# Patient Record
Sex: Female | Born: 1990 | Hispanic: Yes | Marital: Single | Smoking: Former smoker
Health system: Southern US, Community
[De-identification: ages and names within clinical notes are randomized; demographics above are authoritative.]

## PROBLEM LIST (undated history)

## (undated) DIAGNOSIS — F419 Anxiety disorder, unspecified: Secondary | ICD-10-CM

## (undated) DIAGNOSIS — A5609 Other chlamydial infection of lower genitourinary tract: Secondary | ICD-10-CM

## (undated) DIAGNOSIS — A5602 Chlamydial vulvovaginitis: Secondary | ICD-10-CM

## (undated) DIAGNOSIS — N946 Dysmenorrhea, unspecified: Secondary | ICD-10-CM

## (undated) DIAGNOSIS — F320 Major depressive disorder, single episode, mild: Secondary | ICD-10-CM

## (undated) DIAGNOSIS — D649 Anemia, unspecified: Secondary | ICD-10-CM

## (undated) DIAGNOSIS — E039 Hypothyroidism, unspecified: Secondary | ICD-10-CM

## (undated) HISTORY — DX: Other chlamydial infection of lower genitourinary tract: A56.09

## (undated) HISTORY — DX: Anxiety disorder, unspecified: F41.9

## (undated) HISTORY — DX: Anemia, unspecified: D64.9

## (undated) HISTORY — DX: Major depressive disorder, single episode, mild: F32.0

## (undated) HISTORY — DX: Chlamydial vulvovaginitis: A56.02

## (undated) HISTORY — DX: Hypothyroidism, unspecified: E03.9

## (undated) HISTORY — DX: Dysmenorrhea, unspecified: N94.6

## (undated) HISTORY — PX: BREAST ENHANCEMENT SURGERY: SHX7

## (undated) HISTORY — PX: DILATION AND CURETTAGE OF UTERUS: SHX78

---
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
## 2011-02-01 LAB — PREGNANCY, URINE: Preg Test, Ur: NEGATIVE

## 2011-02-01 LAB — URINALYSIS, ROUTINE W REFLEX MICROSCOPIC
Bilirubin Urine: NEGATIVE
Nitrite: NEGATIVE
Specific Gravity, Urine: 1.01 (ref 1.005–1.030)
Urobilinogen, UA: 0.2 mg/dL (ref 0.0–1.0)
pH: 7.5 (ref 5.0–8.0)

## 2011-02-01 LAB — WET PREP, GENITAL: Yeast Wet Prep HPF POC: NONE SEEN

## 2011-02-01 LAB — GC/CHLAMYDIA PROBE AMP, GENITAL: Chlamydia, DNA Probe: NEGATIVE

## 2012-03-11 DIAGNOSIS — Z331 Pregnant state, incidental: Secondary | ICD-10-CM | POA: Insufficient documentation

## 2015-01-13 ENCOUNTER — Other Ambulatory Visit (HOSPITAL_COMMUNITY): Payer: Self-pay | Admitting: Maternal and Fetal Medicine

## 2017-02-05 ENCOUNTER — Ambulatory Visit (INDEPENDENT_AMBULATORY_CARE_PROVIDER_SITE_OTHER): Payer: Managed Care, Other (non HMO)

## 2017-02-05 ENCOUNTER — Other Ambulatory Visit: Payer: Self-pay | Admitting: Physician Assistant

## 2017-02-05 ENCOUNTER — Encounter: Payer: Self-pay | Admitting: Physician Assistant

## 2017-02-05 ENCOUNTER — Other Ambulatory Visit: Payer: Self-pay

## 2017-02-05 ENCOUNTER — Ambulatory Visit (INDEPENDENT_AMBULATORY_CARE_PROVIDER_SITE_OTHER): Payer: Managed Care, Other (non HMO) | Admitting: Physician Assistant

## 2017-02-05 VITALS — BP 99/63 | HR 76 | Ht 63.5 in | Wt 153.0 lb

## 2017-02-05 DIAGNOSIS — J069 Acute upper respiratory infection, unspecified: Secondary | ICD-10-CM | POA: Diagnosis not present

## 2017-02-05 DIAGNOSIS — N946 Dysmenorrhea, unspecified: Secondary | ICD-10-CM

## 2017-02-05 DIAGNOSIS — R062 Wheezing: Secondary | ICD-10-CM

## 2017-02-05 DIAGNOSIS — F320 Major depressive disorder, single episode, mild: Secondary | ICD-10-CM

## 2017-02-05 DIAGNOSIS — Z0000 Encounter for general adult medical examination without abnormal findings: Secondary | ICD-10-CM

## 2017-02-05 DIAGNOSIS — F419 Anxiety disorder, unspecified: Secondary | ICD-10-CM

## 2017-02-05 DIAGNOSIS — E039 Hypothyroidism, unspecified: Secondary | ICD-10-CM | POA: Diagnosis not present

## 2017-02-05 DIAGNOSIS — R05 Cough: Secondary | ICD-10-CM

## 2017-02-05 HISTORY — DX: Dysmenorrhea, unspecified: N94.6

## 2017-02-05 HISTORY — DX: Major depressive disorder, single episode, mild: F32.0

## 2017-02-05 HISTORY — DX: Anxiety disorder, unspecified: F41.9

## 2017-02-05 LAB — COMPREHENSIVE METABOLIC PANEL
ALK PHOS: 58 U/L (ref 33–115)
ALT: 13 U/L (ref 6–29)
AST: 20 U/L (ref 10–30)
Albumin: 4.6 g/dL (ref 3.6–5.1)
BILIRUBIN TOTAL: 0.8 mg/dL (ref 0.2–1.2)
BUN: 13 mg/dL (ref 7–25)
CO2: 23 mmol/L (ref 20–31)
Calcium: 9.7 mg/dL (ref 8.6–10.2)
Chloride: 103 mmol/L (ref 98–110)
Creat: 0.9 mg/dL (ref 0.50–1.10)
GLUCOSE: 70 mg/dL (ref 65–99)
Potassium: 4.3 mmol/L (ref 3.5–5.3)
Sodium: 140 mmol/L (ref 135–146)
TOTAL PROTEIN: 7.7 g/dL (ref 6.1–8.1)

## 2017-02-05 LAB — CBC
HEMATOCRIT: 43.7 % (ref 35.0–45.0)
Hemoglobin: 14.4 g/dL (ref 11.7–15.5)
MCH: 29.1 pg (ref 27.0–33.0)
MCHC: 33 g/dL (ref 32.0–36.0)
MCV: 88.5 fL (ref 80.0–100.0)
MPV: 11.5 fL (ref 7.5–12.5)
PLATELETS: 265 10*3/uL (ref 140–400)
RBC: 4.94 MIL/uL (ref 3.80–5.10)
RDW: 12.9 % (ref 11.0–15.0)
WBC: 7 10*3/uL (ref 3.8–10.8)

## 2017-02-05 LAB — TSH: TSH: 5.85 mIU/L — ABNORMAL HIGH

## 2017-02-05 LAB — LIPID PANEL W/REFLEX DIRECT LDL
CHOLESTEROL: 134 mg/dL (ref <200)
HDL: 61 mg/dL (ref >50)
LDL-Cholesterol: 61 mg/dL
Non-HDL Cholesterol (Calc): 73 mg/dL (ref <130)
TRIGLYCERIDES: 43 mg/dL (ref <150)
Total CHOL/HDL Ratio: 2.2 Ratio (ref <5.0)

## 2017-02-05 MED ORDER — AZITHROMYCIN 250 MG PO TABS: ORAL_TABLET | ORAL | 0 refills | Status: DC

## 2017-02-05 MED ORDER — FLUTICASONE PROPIONATE 50 MCG/ACT NA SUSP: 1.0000 | Freq: Two times a day (BID) | NASAL | 0 refills | Status: DC

## 2017-02-05 NOTE — Progress Notes (Signed)
HPI:                                                                Sabrina Park is a 26 y.o. female who presents to [facility]: Primary Care Sports Medicine today to establish care   Current Concerns include congestion  Patient reports nasal congestion x 2 months. Endorses cough x 1 week. Has noticed some dyspnea with exertion. Denies wheezing, chest pain, SOB, hemoptysis. Denies history of allergies or asthma.   Health Maintenance Health Maintenance  Topic Date Due  . HIV Screening  06/23/2006  . TETANUS/TDAP  06/23/2010  . PAP SMEAR  06/23/2012  . INFLUENZA VACCINE  05/28/2017    GYN/Sexual Health  Menstrual status: having periods  LMP: 02/05/17  Menses: regular, dysmenorrhea  Last pap smear: 11/2016, NILM - performed at gynecologist in [place]  History of abnormal pap smears: no  Sexually active: not currently  Current contraception: none  Health Habits  Diet: good  Exercise: cardio weight daily  Past Medical History:  Diagnosis Date  . Anemia    Past Surgical History:  Procedure Laterality Date  . DILATION AND CURETTAGE OF UTERUS     Social History  Substance Use Topics  . Smoking status: Former Smoker    Years: 0.50    Types: Cigarettes    Quit date: 01/2015  . Smokeless tobacco: Never Used     Comment: few cigarettes per week  . Alcohol use 1.2 oz/week    2 Glasses of wine per week   family history includes Autoimmune disease in her sister; Diabetes in her mother.  ROS: negative except as noted in the HPI  Medications: Current Outpatient Prescriptions  Medication Sig Dispense Refill  . azithromycin (ZITHROMAX Z-PAK) 250 MG tablet Take 2 tablets (500 mg) on  Day 1,  followed by 1 tablet (250 mg) once daily on Days 2 through 5. 6 tablet 0  . ferrous sulfate 325 (65 FE) MG tablet Take 325 mg by mouth daily.    . fluticasone (FLONASE) 50 MCG/ACT nasal spray Place 1 spray into both nostrils 2 (two) times daily. 1 g 0  .  Multiple Vitamins-Minerals (THERA-M) TABS Take by mouth.     No current facility-administered medications for this visit.    No Known Allergies     Objective:  BP 99/63 (Patient Position: Sitting, Cuff Size: Normal)   Pulse 76   Ht 5' 3.5" (1.613 m)   Wt 153 lb (69.4 kg)   SpO2 96%   BMI 26.68 kg/m  Gen: well-groomed, cooperative, not ill-appearing, no distress HEENT: normal conjunctiva, wearing glasses, TM's clear, oropharynx clear, moist mucus membranes, no thyromegaly or tenderness Pulm: Normal work of breathing, normal phonation, diffuse expiratory wheezes bilaterally, breath sounds diminished, no rales or rhonchi CV: Normal rate, regular rhythm, s1 and s2 distinct, no murmurs, clicks or rubs, no carotid bruit GI: abdomen soft, nondistended, nontender, no masses Neuro: alert and oriented x 3, EOM's intact, PERRLA, DTR's intact, normal tone, no tremor MSK: moving all extremities, normal gait and station, no peripheral edema Skin: warm and dry, no rashes or lesions on exposed skin Psych: normal affect, euthymic mood, normal speech and thought content  Depression screen [facility] 2/9 02/05/2017  Decreased Interest 1  Down, Depressed,  Hopeless 2  PHQ - 2 Score 3  Altered sleeping 1  Tired, decreased energy 1  Change in appetite 0  Feeling bad or failure about yourself  0  Trouble concentrating 0  Moving slowly or fidgety/restless 0  Suicidal thoughts 0  PHQ-9 Score 5   GAD 7 : Generalized Anxiety Score 02/05/2017  Nervous, Anxious, on Edge 1  Control/stop worrying 1  Worry too much - different things 2  Trouble relaxing 2  Restless 1  Easily annoyed or irritable 2  Afraid - awful might happen 0  Total GAD 7 Score 9      Assessment and Plan: 26 y.o. female with   1. Annual physical exam - CBC - Comprehensive metabolic panel - HIV antibody - Lipid Panel w/reflex Direct LDL - TSH  2. Wheezing - DG Chest 2 View to r/o infiltrate - suspect acute bronchitis v.  Reactive airway disease - recommend follow-up spirometry if no improvement in 2 weeks after antibiotics   3. Acute upper respiratory infection - azithromycin (ZITHROMAX Z-PAK) 250 MG tablet; Take 2 tablets (500 mg) on  Day 1,  followed by 1 tablet (250 mg) once daily on Days 2 through 5.  Dispense: 6 tablet; Refill: 0 - fluticasone (FLONASE) 50 MCG/ACT nasal spray; Place 1 spray into both nostrils 2 (two) times daily.  Dispense: 1 g; Refill: 0  4. MDD, mild; Anxiety - after completing patient's physical, nurse had patient complete PHQ9 and GAD. Patient's scores showed mild MDD and moderate anxiety - instructed to follow-up in 1 week to address these issues fully   Patient education and anticipatory guidance given Patient agrees with treatment plan Follow-up in 1 week for depression/anxiety or sooner as needed  Levonne Hubert PA-C

## 2017-02-05 NOTE — Patient Instructions (Addendum)
Go downstairs for labs and chest x-ray We will call you with your lab results and when your biometrics form is ready for pick-up  Take antibiotic as prescribed for 5 days Use your nasal spray, 1 spray each nostril, twice a day   To use your nasal spray: - blow your nose - tilt head back - block one nostril - aim away from your nasal septum (toward your eye) - dispense and gently inhale (DO NOT SNIFF) - wait 5 seconds - repeat on other side - wait at least 15 minutes to blow your nose again   Have your Pap smear results faxed to our office [phone]   Bronchospasm, Adult Bronchospasm is a tightening of the airways going into the lungs. During an episode, it may be harder to breathe. You may cough, and you may make a whistling sound when you breathe (wheeze). This condition often affects people with asthma. What are the causes? This condition is caused by swelling and irritation in the airways. It can be triggered by:  An infection (common).  Seasonal allergies.  An allergic reaction.  Exercise.  Irritants. These include pollution, cigarette smoke, strong odors, aerosol sprays, and paint fumes.  Weather changes. Winds increase molds and pollens in the air. Cold air may cause swelling.  Stress and emotional upset. What are the signs or symptoms? Symptoms of this condition include:  Wheezing. If the episode was triggered by an allergy, wheezing may start right away or hours later.  Nighttime coughing.  Frequent or severe coughing with a simple cold.  Chest tightness.  Shortness of breath.  Decreased ability to exercise. How is this diagnosed? This condition is usually diagnosed with a review of your medical history and a physical exam. Tests, such as lung function tests, are sometimes done to look for other conditions. The need for a chest X-ray depends on where the wheezing occurs and whether it is the first time you have wheezed. How is this treated? This  condition may be treated with:  Inhaled medicines. These open up the airways and help you breathe. They can be taken with an inhaler or a nebulizer device.  Corticosteroid medicines. These may be given for severe bronchospasm, usually when it is associated with asthma.  Avoiding triggers, such as irritants, infection, or allergies. Follow these instructions at home: Medicines   Take over-the-counter and prescription medicines only as told by your health care provider.  If you need to use an inhaler or nebulizer to take your medicine, ask your health care provider to explain how to use it correctly. If you were given a spacer, always use it with your inhaler. Lifestyle   Reduce the number of triggers in your home. To do this:  Change your heating and air conditioning filter at least once a month.  Limit your use of fireplaces and wood stoves.  Do not smoke. Do not allow smoking in your home.  Avoid using perfumes and fragrances.  Get rid of pests, such as roaches and mice, and their droppings.  Remove any mold from your home.  Keep your house clean and dust free. Use unscented cleaning products.  Replace carpet with wood, tile, or vinyl flooring. Carpet can trap dander and dust.  Use allergy-proof pillows, mattress covers, and box spring covers.  Wash bed sheets and blankets every week in hot water. Dry them in a dryer.  Use blankets that are made of polyester or cotton.  Wash your hands often.  Do not allow pets in  your bedroom.  Avoid breathing in cold air when you exercise. General instructions   Have a plan for seeking medical care. Know when to call your health care provider and local emergency services, and where to get emergency care.  Stay up to date on your immunizations.  When you have an episode of bronchospasm, stay calm. Try to relax and breathe more slowly.  If you have asthma, make sure you have an asthma action plan.  Keep all follow-up visits as  told by your health care provider. This is important. Contact a health care provider if:  You have muscle aches.  You have chest pain.  The mucus that you cough up (sputum) changes from clear or white to yellow, green, gray, or bloody.  You have a fever.  Your sputum gets thicker. Get help right away if:  Your wheezing and coughing get worse, even after you take your prescribed medicines.  It gets even harder to breathe.  You develop severe chest pain. Summary  Bronchospasm is a tightening of the airways going into the lungs.  During an episode of bronchospasm, you may have a harder time breathing. You may cough and make a whistling sound when you breathe (wheeze).  Avoid exposure to triggers such as smoke, dust, mold, animal dander, and fragrances.  When you have an episode of bronchospasm, stay calm. Try to relax and breathe more slowly. This information is not intended to replace advice given to you by your health care provider. Make sure you discuss any questions you have with your health care provider. Document Released: 10/17/2003 Document Revised: 10/10/2016 Document Reviewed: 10/10/2016 Elsevier Interactive Patient Education  2017 [employer].

## 2017-02-06 LAB — T4, FREE: Free T4: 0.7 ng/dL — ABNORMAL LOW (ref 0.8–1.8)

## 2017-02-06 LAB — T3, FREE: T3 FREE: 2.6 pg/mL (ref 2.3–4.2)

## 2017-02-06 LAB — HIV ANTIBODY (ROUTINE TESTING W REFLEX): HIV 1&2 Ab, 4th Generation: NONREACTIVE

## 2017-02-06 MED ORDER — LEVOTHYROXINE SODIUM 50 MCG PO TABS: 50.0000 ug | ORAL_TABLET | Freq: Every day | ORAL | 3 refills | Status: DC

## 2017-02-06 NOTE — Progress Notes (Signed)
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  Starting 50 mcg of Levothyroxine. Recheck TSH in 6 weeks.

## 2017-02-06 NOTE — Addendum Note (Signed)
Addended by: Gena Fray E on: 02/06/2017 04:07 PM   Modules accepted: Orders

## 2017-02-19 ENCOUNTER — Ambulatory Visit: Payer: Managed Care, Other (non HMO) | Admitting: Physician Assistant

## 2017-02-24 ENCOUNTER — Telehealth: Payer: Self-pay

## 2017-02-24 NOTE — Telephone Encounter (Signed)
Notified patient, she was going to call to see if it has been received, if not she will fax another to be completed.

## 2017-02-24 NOTE — Telephone Encounter (Signed)
Pt was asking if her papers she brought in at her physical were filled out and faxed, or if she needs to come by and pick them up.  Please advise.

## 2017-02-24 NOTE — Telephone Encounter (Signed)
Unfortunately, unable to locate any papers here. We may have faxed it. If she needs another copy faxed, she can fax or drop off another form here and we will fill it out asap

## 2017-05-08 ENCOUNTER — Ambulatory Visit (INDEPENDENT_AMBULATORY_CARE_PROVIDER_SITE_OTHER): Payer: Managed Care, Other (non HMO) | Admitting: Physician Assistant

## 2017-05-08 ENCOUNTER — Encounter: Payer: Self-pay | Admitting: Physician Assistant

## 2017-05-08 VITALS — BP 97/50 | HR 55 | Temp 98.6°F | Wt 148.0 lb

## 2017-05-08 DIAGNOSIS — E039 Hypothyroidism, unspecified: Secondary | ICD-10-CM

## 2017-05-08 DIAGNOSIS — E038 Other specified hypothyroidism: Secondary | ICD-10-CM

## 2017-05-08 DIAGNOSIS — R110 Nausea: Secondary | ICD-10-CM

## 2017-05-08 DIAGNOSIS — Z131 Encounter for screening for diabetes mellitus: Secondary | ICD-10-CM

## 2017-05-08 DIAGNOSIS — R197 Diarrhea, unspecified: Secondary | ICD-10-CM | POA: Diagnosis not present

## 2017-05-08 HISTORY — DX: Other specified hypothyroidism: E03.8

## 2017-05-08 MED ORDER — CULTURELLE PO CAPS: 1.0000 | ORAL_CAPSULE | Freq: Every day | ORAL | 11 refills | Status: DC

## 2017-05-08 MED ORDER — ONDANSETRON HCL 4 MG PO TABS: 4.0000 mg | ORAL_TABLET | Freq: Three times a day (TID) | ORAL | 0 refills | Status: DC | PRN

## 2017-05-08 MED ORDER — LOPERAMIDE HCL 2 MG PO TABS: 2.0000 mg | ORAL_TABLET | Freq: Four times a day (QID) | ORAL | 0 refills | Status: DC | PRN

## 2017-05-08 NOTE — Patient Instructions (Addendum)
-   Plan to go downstairs for labs today - Dissolve 1 ondansetron (Zofran) under the tongue as needed for nausea - Start Loperamide (Imodium) 2mg  once for diarrhea. Then repeat up to 4 times daily as needed for loose stools. Stop medicine once you are stooling normally - Start daily probiotic  - Bland diet for 72 hours or until stooling normally   Bland Diet A bland diet consists of foods that do not have a lot of fat or fiber. Foods without fat or fiber are easier for the body to digest. They are also less likely to irritate your mouth, throat, stomach, and other parts of your gastrointestinal tract. A bland diet is sometimes called a BRAT diet. What is my plan? Your health care provider or dietitian may recommend specific changes to your diet to prevent and treat your symptoms, such as:  Eating small meals often.  Cooking food until it is soft enough to chew easily.  Chewing your food well.  Drinking fluids slowly.  Not eating foods that are very spicy, sour, or fatty.  Not eating citrus fruits, such as oranges and grapefruit.  What do I need to know about this diet?  Eat a variety of foods from the bland diet food list.  Do not follow a bland diet longer than you have to.  Ask your health care provider whether you should take vitamins. What foods can I eat? Grains  Hot cereals, such as cream of wheat. Bread, crackers, or tortillas made from refined white flour. Rice. Vegetables Canned or cooked vegetables. Mashed or boiled potatoes. Fruits Bananas. Applesauce. Other types of cooked or canned fruit with the skin and seeds removed, such as canned peaches or pears. Meats and Other Protein Sources Scrambled eggs. Creamy peanut butter or other nut butters. Lean, well-cooked meats, such as chicken or fish. Tofu. Soups or broths. Dairy Low-fat dairy products, such as milk, cottage cheese, or yogurt. Beverages Water. Herbal tea. Apple juice. Sweets and Desserts Pudding.  Custard. Fruit gelatin. Ice cream. Fats and Oils Mild salad dressings. Canola or olive oil. The items listed above may not be a complete list of allowed foods or beverages. Contact your dietitian for more options. What foods are not recommended? Foods and ingredients that are often not recommended include:  Spicy foods, such as hot sauce or salsa.  Fried foods.  Sour foods, such as pickled or fermented foods.  Raw vegetables or fruits, especially citrus or berries.  Caffeinated drinks.  Alcohol.  Strongly flavored seasonings or condiments.  The items listed above may not be a complete list of foods and beverages that are not allowed. Contact your dietitian for more information. This information is not intended to replace advice given to you by your health care provider. Make sure you discuss any questions you have with your health care provider. Document Released: 02/05/2016 Document Revised: 03/21/2016 Document Reviewed: 10/26/2014 Elsevier Interactive Patient Education  2018 [employer]

## 2017-05-08 NOTE — Progress Notes (Signed)
HPI:                                                                Sabrina Park is a 26 y.o. female who [facility] today for diarrhea  Patient with PMH hypothyroidism, anemia, depression reports loose stools daily x 2 weeks. She reports 5 episodes today. Endorses urgency. Denies blood or mucus. Endorses anorexia and nausea without vomiting or abdominal pain. Denies polydipsia and polyuria. No recent travel. No sick contacts. No recent antibiotics or risk factors for C. Diff. No history of GI disorder or abdominal surgeries.  Past Medical History:  Diagnosis Date  . Anemia   . Anxiety 02/05/2017  . Dysmenorrhea 02/05/2017  . Mild single current episode of major depressive disorder (HCC) 02/05/2017  . Subclinical hypothyroidism 05/08/2017   Past Surgical History:  Procedure Laterality Date  . BREAST ENHANCEMENT SURGERY    . DILATION AND CURETTAGE OF UTERUS     Social History  Substance Use Topics  . Smoking status: Former Smoker    Years: 0.50    Types: Cigarettes    Quit date: 01/2015  . Smokeless tobacco: Never Used     Comment: few cigarettes per week  . Alcohol use 1.2 oz/week    2 Glasses of wine per week   family history includes Autoimmune disease in her sister; Diabetes in her mother.  ROS: Review of Systems  Constitutional: Positive for malaise/fatigue. Negative for chills and fever.  HENT: Negative.   Eyes: Negative.   Respiratory: Negative.   Cardiovascular: Negative.   Gastrointestinal: Positive for diarrhea and nausea. Negative for abdominal pain, blood in stool, melena and vomiting.  Genitourinary: Negative.   Musculoskeletal: Negative.   Skin: Negative for rash.  Neurological: Positive for dizziness and weakness. Negative for tingling, tremors, sensory change, focal weakness, seizures and loss of consciousness.     Medications: Current Outpatient Prescriptions  Medication Sig Dispense Refill   . fluticasone (FLONASE) 50 MCG/ACT nasal spray Place 1 spray into both nostrils 2 (two) times daily. 1 g 0  . levothyroxine (SYNTHROID, LEVOTHROID) 50 MCG tablet Take 1 tablet (50 mcg total) by mouth daily before breakfast. 30 tablet 3  . Lactobacillus Rhamnosus, GG, (CULTURELLE) CAPS Take 1 capsule by mouth daily. 30 capsule 11  . loperamide (IMODIUM A-D) 2 MG tablet Take 1 tablet (2 mg total) by mouth 4 (four) times daily as needed for diarrhea or loose stools. 30 tablet 0  . ondansetron (ZOFRAN) 4 MG tablet Take 1 tablet (4 mg total) by mouth every 8 (eight) hours as needed for nausea or vomiting. 10 tablet 0   No current facility-administered medications for this visit.    No Known Allergies     Objective:  BP (!) 97/50   Pulse (!) 55   Temp 98.6 F (37 C) (Oral)   Wt 148 lb (67.1 kg)   LMP 04/30/2017   SpO2 100%   BMI 25.81 kg/m  Gen: well-groomed, cooperative, not ill-appearing, no distress HEENT: normal conjunctiva, no icterus, oropharynx clear, moist mucus membranes, neck supple, trachea midline Pulm: Normal work of breathing, normal phonation, clear to auscultation bilaterally, no wheezes, rales or rhonchi CV: Bradycardia, regular rhythm, s1 and s2 distinct, no murmurs, clicks or  rubs  GI: abdomen normal appearing, bowel sounds active, soft, nondistended, nontender, no masses, negative Murphy's sign Neuro: alert and oriented x 3, EOM's intact, no tremor MSK: extremities atraumatic, normal gait and station, no peripheral edema Lymph: no cervical or tonsillar adenopathy Skin: warm, dry, intact; no rashes or lesions on exposed skin, no jaundice   No results found for this or any previous visit (from the past 72 hour(s)). No results found.    Assessment and Plan: 26 y.o. female with   1. Diarrhea, unspecified type - Comprehensive metabolic panel - CBC with Differential/Platelet - Stool culture - Ova and parasite examination - Clostridium Difficile by PCR -  loperamide (IMODIUM A-D) 2 MG tablet; Take 1 tablet (2 mg total) by mouth 4 (four) times daily as needed for diarrhea or loose stools.  Dispense: 30 tablet; Refill: 0 - Lactobacillus Rhamnosus, GG, (CULTURELLE) CAPS; Take 1 capsule by mouth daily.  Dispense: 30 capsule; Refill: 11  2. Subclinical hypothyroidism - compliant with Levothyroxine QAM - TSH  3. Screening for diabetes mellitus - Hemoglobin A1c  4. Nausea - ondansetron (ZOFRAN) 4 MG tablet; Take 1 tablet (4 mg total) by mouth every 8 (eight) hours as needed for nausea or vomiting.  Dispense: 10 tablet; Refill: 0  Patient education and anticipatory guidance given Patient agrees with treatment plan Follow-up in 1 week or sooner as needed if symptoms worsen or fail to improve  Levonne Hubert PA-C

## 2017-05-09 LAB — COMPREHENSIVE METABOLIC PANEL
ALT: 13 U/L (ref 6–29)
AST: 19 U/L (ref 10–30)
Albumin: 4.5 g/dL (ref 3.6–5.1)
Alkaline Phosphatase: 48 U/L (ref 33–115)
BUN: 10 mg/dL (ref 7–25)
CALCIUM: 9.5 mg/dL (ref 8.6–10.2)
CHLORIDE: 104 mmol/L (ref 98–110)
CO2: 23 mmol/L (ref 20–31)
Creat: 0.88 mg/dL (ref 0.50–1.10)
GLUCOSE: 81 mg/dL (ref 65–99)
POTASSIUM: 4.1 mmol/L (ref 3.5–5.3)
Sodium: 138 mmol/L (ref 135–146)
Total Bilirubin: 0.6 mg/dL (ref 0.2–1.2)
Total Protein: 7 g/dL (ref 6.1–8.1)

## 2017-05-09 LAB — CBC WITH DIFFERENTIAL/PLATELET
BASOS ABS: 58 {cells}/uL (ref 0–200)
Basophils Relative: 1 %
EOS ABS: 58 {cells}/uL (ref 15–500)
EOS PCT: 1 %
HCT: 38.5 % (ref 35.0–45.0)
HEMOGLOBIN: 12.4 g/dL (ref 11.7–15.5)
LYMPHS ABS: 2320 {cells}/uL (ref 850–3900)
Lymphocytes Relative: 40 %
MCH: 29.2 pg (ref 27.0–33.0)
MCHC: 32.2 g/dL (ref 32.0–36.0)
MCV: 90.8 fL (ref 80.0–100.0)
MONO ABS: 348 {cells}/uL (ref 200–950)
MPV: 11.1 fL (ref 7.5–12.5)
Monocytes Relative: 6 %
Neutro Abs: 3016 cells/uL (ref 1500–7800)
Neutrophils Relative %: 52 %
Platelets: 278 10*3/uL (ref 140–400)
RBC: 4.24 MIL/uL (ref 3.80–5.10)
RDW: 13.8 % (ref 11.0–15.0)
WBC: 5.8 10*3/uL (ref 3.8–10.8)

## 2017-05-09 LAB — HEMOGLOBIN A1C
HEMOGLOBIN A1C: 4.8 % (ref <5.7)
Mean Plasma Glucose: 91 mg/dL

## 2017-05-09 LAB — TSH: TSH: 1.95 m[IU]/L

## 2017-05-09 LAB — CLOSTRIDIUM DIFFICILE BY PCR: CDIFFPCR: NOT DETECTED

## 2017-05-12 LAB — STOOL CULTURE

## 2017-05-14 NOTE — Progress Notes (Signed)
Labs are all within normal limits Stool culture was negative for bacteria C. Diff test was also negative The lab made an error and did not complete testing for ova and parasites If symptoms are persisting, I recommend returning to the lab for this and we will make sure it is free of charge

## 2017-05-15 ENCOUNTER — Other Ambulatory Visit (HOSPITAL_COMMUNITY)
Admission: RE | Admit: 2017-05-15 | Discharge: 2017-05-15 | Disposition: A | Discharge status: Home / Self Care | Payer: Managed Care, Other (non HMO) | Source: Ambulatory Visit | Attending: Physician Assistant | Admitting: Physician Assistant

## 2017-05-15 ENCOUNTER — Encounter: Payer: Self-pay | Admitting: Physician Assistant

## 2017-05-15 ENCOUNTER — Ambulatory Visit (INDEPENDENT_AMBULATORY_CARE_PROVIDER_SITE_OTHER): Payer: Managed Care, Other (non HMO) | Admitting: Physician Assistant

## 2017-05-15 VITALS — BP 115/72 | HR 53 | Wt 149.0 lb

## 2017-05-15 DIAGNOSIS — Z124 Encounter for screening for malignant neoplasm of cervix: Secondary | ICD-10-CM | POA: Diagnosis not present

## 2017-05-15 DIAGNOSIS — A5609 Other chlamydial infection of lower genitourinary tract: Secondary | ICD-10-CM

## 2017-05-15 DIAGNOSIS — A5602 Chlamydial vulvovaginitis: Secondary | ICD-10-CM

## 2017-05-15 DIAGNOSIS — Z23 Encounter for immunization: Secondary | ICD-10-CM | POA: Diagnosis not present

## 2017-05-15 DIAGNOSIS — N898 Other specified noninflammatory disorders of vagina: Secondary | ICD-10-CM | POA: Diagnosis not present

## 2017-05-15 DIAGNOSIS — Z113 Encounter for screening for infections with a predominantly sexual mode of transmission: Secondary | ICD-10-CM | POA: Diagnosis not present

## 2017-05-15 DIAGNOSIS — A749 Chlamydial infection, unspecified: Secondary | ICD-10-CM | POA: Insufficient documentation

## 2017-05-15 NOTE — Patient Instructions (Signed)
Preventing Cervical Cancer Cervical cancer is cancer that grows on the cervix. The cervix is at the bottom of the uterus. It connects the uterus to the vagina. The uterus is where a baby develops during pregnancy. Cancer occurs when cells become abnormal and start to grow out of control. Cervical cancer grows slowly and may not cause any symptoms at first. Over time, the cancer can grow deep into the cervix tissue and spread to other areas. If it is found early, cervical cancer can be treated effectively. You can also take steps to prevent this type of cancer. Most cases of cervical cancer are caused by an STI (sexually transmitted infection) called human papillomavirus (HPV). One way to reduce your risk of cervical cancer is to avoid infection with the HPV virus. You can do this by practicing safe sex and by getting the HPV vaccine. Getting regular Pap tests is also important because this can help identify changes in cells that could lead to cancer. Your chances of getting this disease can also be reduced by making certain lifestyle changes. How can I protect myself from cervical cancer? Preventing HPV infection  Ask your health care provider about getting the HPV vaccine. If you are 26 years old or younger, you may need to get this vaccine, which is given in three doses over 6 months. This vaccine protects against the types of HPV that could cause cancer.  Limit the number of people you have sex with. Also avoid having sex with people who have had many sex partners.  Use a latex condom during sex. Getting Pap tests  Get Pap tests regularly, starting at age 21. Talk with your health care provider about how often you need these tests. ? Most women who are 21?26 years of age should have a Pap test every 3 years. ? Most women who are 30?26 years of age should have a Pap test in combination with an HPV test every 5 years. ? Women with a higher risk of cervical cancer, such as those with a weakened  immune system or those who have been exposed to the drug diethylstilbestrol (DES), may need more frequent testing. Making other lifestyle changes  Do not use any products that contain nicotine or tobacco, such as cigarettes and e-cigarettes. If you need help quitting, ask your health care provider.  Eat at least 5 servings of fruits and vegetables every day.  Lose weight if you are overweight. Why are these changes important?  These changes and screening tests are designed to address the factors that are known to increase the risk of cervical cancer. Taking these steps is the best way to reduce your risk.  Having regular Pap tests will help identify changes in cells that could lead to cancer. Steps can then be taken to prevent cancer from developing.  These changes will also help find cervical cancer early. This type of cancer can be treated effectively if it is found early. It can be more dangerous and difficult to treat if cancer has grown deep into your cervix or has spread.  In addition to making you less likely to get cervical cancer, these changes will also provide other health benefits, such as the following: ? Practicing safe sex is important for preventing STIs and unplanned pregnancies. ? Avoiding tobacco can reduce your risk for other cancers and health issues. ? Eating a healthy diet and maintaining a healthy weight are good for your overall health. What can happen if changes are not made? In the   early stages, cervical cancer might not have any symptoms. It can take many years for the cancer to grow and get deep into the cervix tissue. This may be happening without you knowing about it. If you develop any symptoms, such as pelvic pain or unusual discharge or bleeding from your vagina, you should see your health care provider right away. If cervical cancer is not found early, you might need treatments such as radiation, chemotherapy, or surgery. In some cases, surgery may mean that  you will not be able to get pregnant or carry a pregnancy to term. Where to find support: Talk with your health care provider, school nurse, or local health department for guidance about screening and vaccination. Some children and teens may be able to get the HPV vaccine free of charge through the U.S. government's Vaccines for Children (VFC) program. Other places that provide vaccinations include:  Public health clinics. Check with your local health department.  Federally Qualified Health Centers, where you would pay only what you can afford. To find one near you, check this website: www.fqhc.org/find-an-fqhc/  Rural Health Clinics. These are part of a program for Medicare and Medicaid patients who live in rural areas.  The National Breast and Cervical Cancer Early Detection Program also provides breast and cervical cancer screenings and diagnostic services to low-income, uninsured, and underinsured women. Cervical cancer can be passed down through families. Talk with your health care provider or genetic counselor to learn more about genetic testing for cancer. Where to find more information: Learn more about cervical cancer from:  American College of Gynecology: www.acog.org/Patients/FAQs/Cervical-Cancer  American Cancer Society: www.cancer.org/cancer/cervicalcancer/  U.S. Centers for Disease Control and Prevention: www.cdc.gov/cancer/cervical/  Summary  Talk with your health care provider about getting the HPV vaccine.  Be sure to get regular Pap tests as recommended by your health care provider.  See your health care provider right away if you have any pelvic pain or unusual discharge or bleeding from your vagina. This information is not intended to replace advice given to you by your health care provider. Make sure you discuss any questions you have with your health care provider. Document Released: 10/29/2015 Document Revised: 06/11/2016 Document Reviewed: 06/11/2016 Elsevier  Interactive Patient Education  2018 Elsevier Inc.  

## 2017-05-15 NOTE — Progress Notes (Signed)
HPI:                                                                Sabrina Park is a 26 y.o. female who [facility] today for Pap only  Current Concerns include: routine STI screening  Patient reports abnormal vaginal discharge for the last 3 days. Denies itching, discomfort, foul odor. Denies fever, chills, pelvic pain. She is sexually active with 1 female partner. They do not use condoms.  Health Maintenance Health Maintenance  Topic Date Due  . PAP SMEAR  06/23/2012  . INFLUENZA VACCINE  05/28/2017  . TETANUS/TDAP  06/24/2026  . HIV Screening  Completed    GYN/Sexual Health  Obstetrics: G3P0030  Menstrual status: having periods  LMP: 04/30/2017  Menses: regular, dysmenorrhea  Last pap smear: unknown  History of abnormal pap smears: no  Sexually active: yes, 1 female parnter  Current contraception: none  Past Medical History:  Diagnosis Date  . Anemia   . Anxiety 02/05/2017  . Dysmenorrhea 02/05/2017  . Mild single current episode of major depressive disorder (HCC) 02/05/2017  . Subclinical hypothyroidism 05/08/2017   Past Surgical History:  Procedure Laterality Date  . BREAST ENHANCEMENT SURGERY    . DILATION AND CURETTAGE OF UTERUS     Social History  Substance Use Topics  . Smoking status: Former Smoker    Years: 0.50    Types: Cigarettes    Quit date: 01/2015  . Smokeless tobacco: Never Used     Comment: few cigarettes per week  . Alcohol use 1.2 oz/week    2 Glasses of wine per week   family history includes Autoimmune disease in her sister; Diabetes in her mother.  ROS: negative except as noted in the HPI  Medications: Current Outpatient Prescriptions  Medication Sig Dispense Refill  . fluticasone (FLONASE) 50 MCG/ACT nasal spray Place 1 spray into both nostrils 2 (two) times daily. 1 g 0  . Lactobacillus Rhamnosus, GG, (CULTURELLE) CAPS Take 1 capsule by mouth daily. 30 capsule 11  .  levothyroxine (SYNTHROID, LEVOTHROID) 50 MCG tablet Take 1 tablet (50 mcg total) by mouth daily before breakfast. 30 tablet 3  . loperamide (IMODIUM A-D) 2 MG tablet Take 1 tablet (2 mg total) by mouth 4 (four) times daily as needed for diarrhea or loose stools. 30 tablet 0  . ondansetron (ZOFRAN) 4 MG tablet Take 1 tablet (4 mg total) by mouth every 8 (eight) hours as needed for nausea or vomiting. 10 tablet 0   No current facility-administered medications for this visit.    No Known Allergies     Objective:  BP 115/72   Pulse (!) 53   Wt 149 lb (67.6 kg)   LMP 04/30/2017 (Exact Date)   BMI 25.98 kg/m  Gen: well-groomed, cooperative, not ill-appearing, no distress HEENT: normal conjunctiva, trachea midline Pulm: Normal work of breathing, normal phonation GU: vulva without rashes or lesions, normal introitus and urethral meatus, vaginal mucosa without erythema, moderate amount of thin, white discharge, cervix non-friable without lesions Neuro: alert and oriented x 3, EOM's intact, normal tone, no tremor MSK: moving all extremities, normal gait and station, no peripheral edema Skin: warm and dry, no rashes or lesions on exposed skin  No results found for this or any previous visit (from the past 72 hour(s)). No results found. A chaperone was used for the GU portion of the exam, Avon Gully, CMA   Assessment and Plan: 26 y.o. female with   1. Encounter for Papanicolaou smear of cervix - Cytology - PAP - discussed birth control. Patient is not interested in contraception at this time  2. Need for HPV vaccination - HPV 9-valent vaccine,Recombinat - instructed to return in 1 month and 6 months respectively for 2nd and 3rd dose  3. Routine screening for STI (sexually transmitted infection) - Cytology - PAP added on GC/Chlamydia, Trichomonas - patient declines serum HIV, RPR and Hep C  4. Vaginal discharge - Gardnerella, Candida, GC/Chlamydia, Trichomonas pending  No  orders of the defined types were placed in this encounter.    Patient education and anticipatory guidance given Patient agrees with treatment plan Follow-up in 1 year for CPE or sooner as needed  Levonne Hubert PA-C

## 2017-05-20 LAB — CYTOLOGY - PAP
BACTERIAL VAGINITIS: NEGATIVE
CHLAMYDIA, DNA PROBE: POSITIVE — AB
Candida vaginitis: NEGATIVE
DIAGNOSIS: NEGATIVE
NEISSERIA GONORRHEA: NEGATIVE
TRICH (WINDOWPATH): NEGATIVE

## 2017-05-21 ENCOUNTER — Encounter: Payer: Self-pay | Admitting: Physician Assistant

## 2017-05-21 DIAGNOSIS — A5602 Chlamydial vulvovaginitis: Secondary | ICD-10-CM

## 2017-05-21 DIAGNOSIS — A5609 Other chlamydial infection of lower genitourinary tract: Secondary | ICD-10-CM

## 2017-05-21 HISTORY — DX: Chlamydial vulvovaginitis: A56.02

## 2017-05-21 NOTE — Progress Notes (Signed)
Pap smear was normal  Further testing did show Chlamydia infection This is sexually transmitted Treatment is antibiotics. Recommend she come in for a nurse visit for treatment All sexual partners within the last 6 months should be notified and tested Current partner will need to be tested and treated. They should use condoms until both have completed therapy  If symptoms persist or return, she should have a repeat test in 3 weeks

## 2017-05-22 ENCOUNTER — Encounter: Payer: Self-pay | Admitting: *Deleted

## 2017-05-27 ENCOUNTER — Ambulatory Visit (INDEPENDENT_AMBULATORY_CARE_PROVIDER_SITE_OTHER): Payer: Managed Care, Other (non HMO) | Admitting: Physician Assistant

## 2017-05-27 VITALS — BP 112/77 | HR 57 | Temp 97.8°F | Wt 149.0 lb

## 2017-05-27 DIAGNOSIS — A5609 Other chlamydial infection of lower genitourinary tract: Secondary | ICD-10-CM

## 2017-05-27 DIAGNOSIS — A5602 Chlamydial vulvovaginitis: Secondary | ICD-10-CM

## 2017-05-27 MED ORDER — AZITHROMYCIN 250 MG PO TABS: 500.0000 mg | ORAL_TABLET | Freq: Once | ORAL | Status: DC

## 2017-05-27 MED ORDER — AZITHROMYCIN 250 MG PO TABS
500.0000 mg | ORAL_TABLET | Freq: Once | ORAL | Status: AC
  Administered 2017-05-27: 500 mg via ORAL

## 2017-05-27 NOTE — Progress Notes (Signed)
Patient was seen by the CMA only today for administration of antibiotic for positive chlamydia infection. Patient denies any new symptoms besides a slight discharge. She has informed her sexual partner and is no longer with him or having sex with him. The patient swallowed two 500 mg tablets (1,000 mg) of azithromycin without complication. Reiterated providers instruction from 05/21/17 result note. Patient voiced understanding.

## 2018-07-19 IMAGING — DX DG CHEST 2V
2 series · 2 of 2 positions shown · non-contrast
Comparison: None.

CLINICAL DATA: Wheezing.  Cough.  One week duration.

EXAM:
CHEST  2 VIEW

[chest pa]
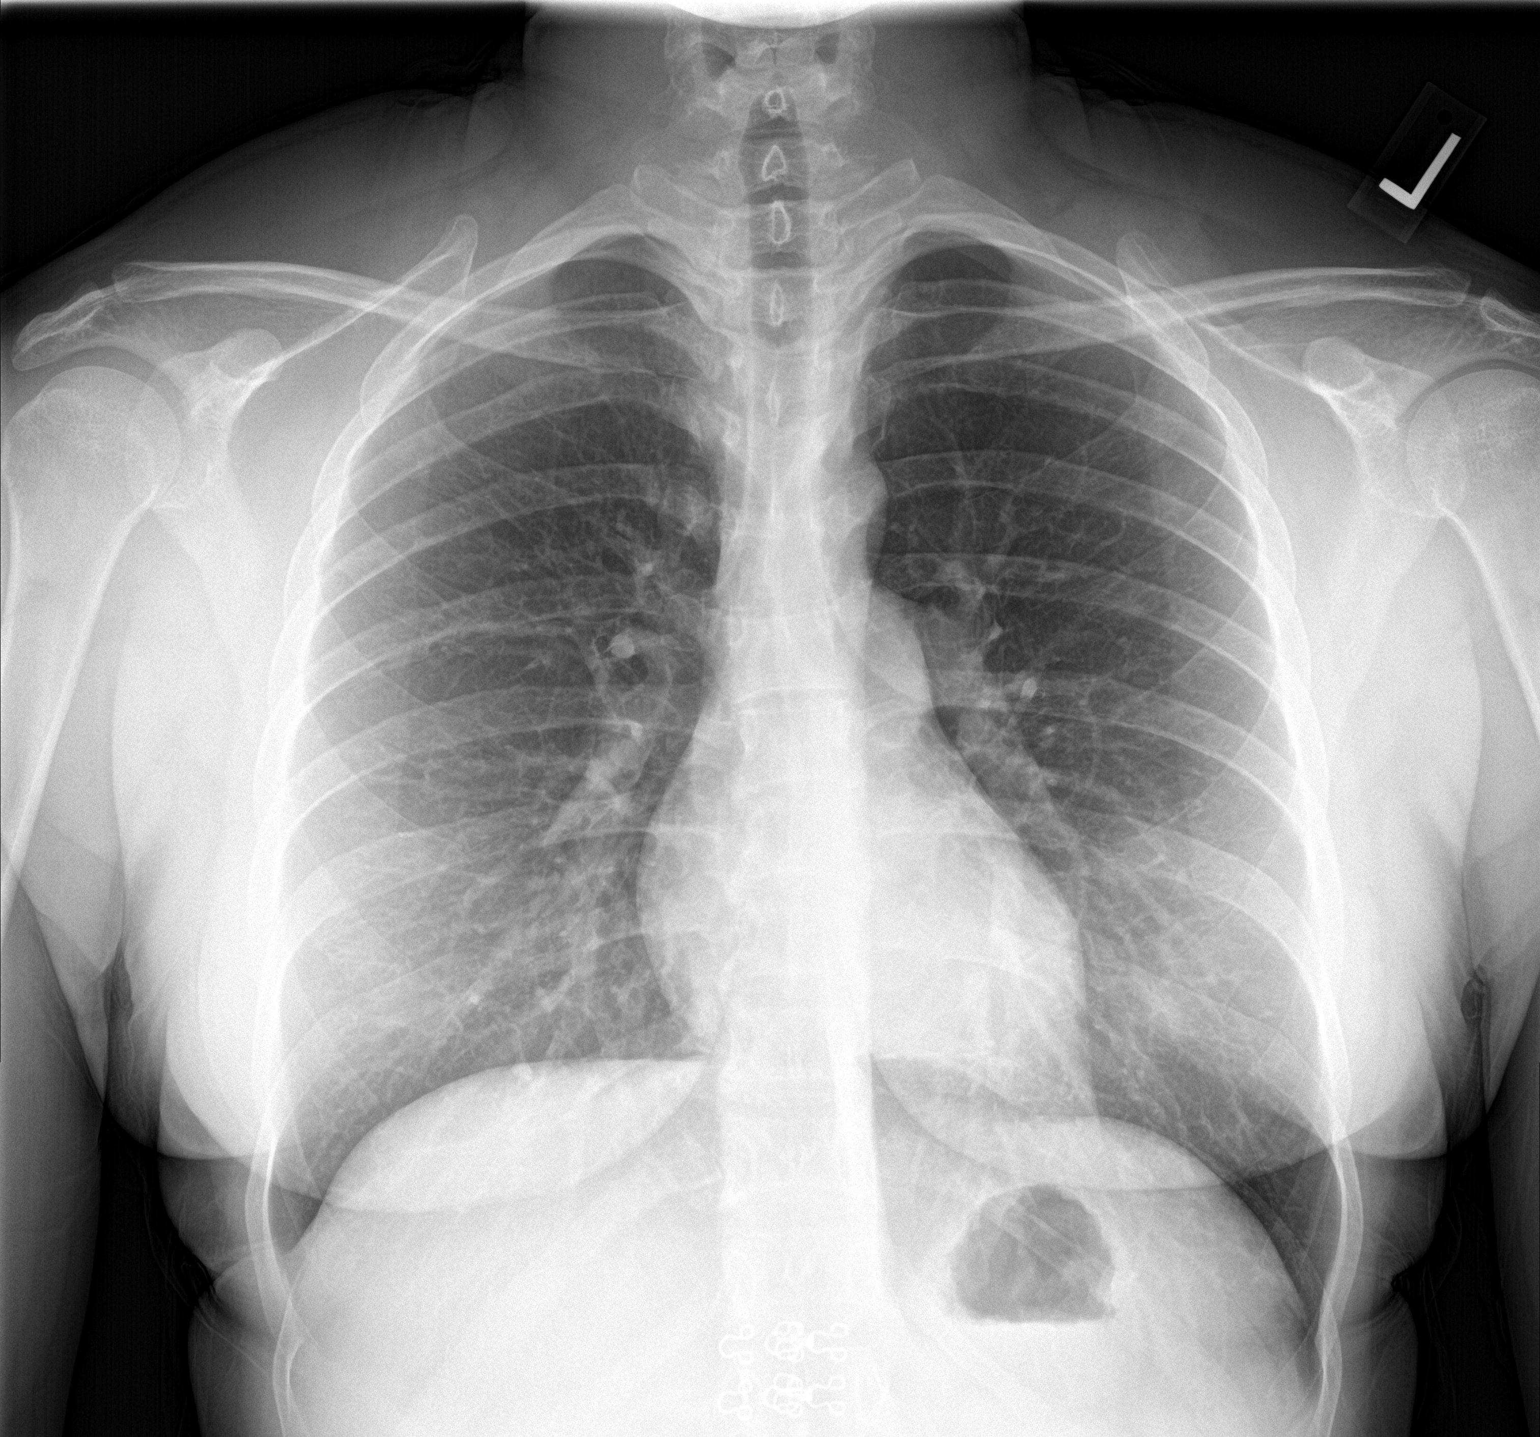

[chest lat]
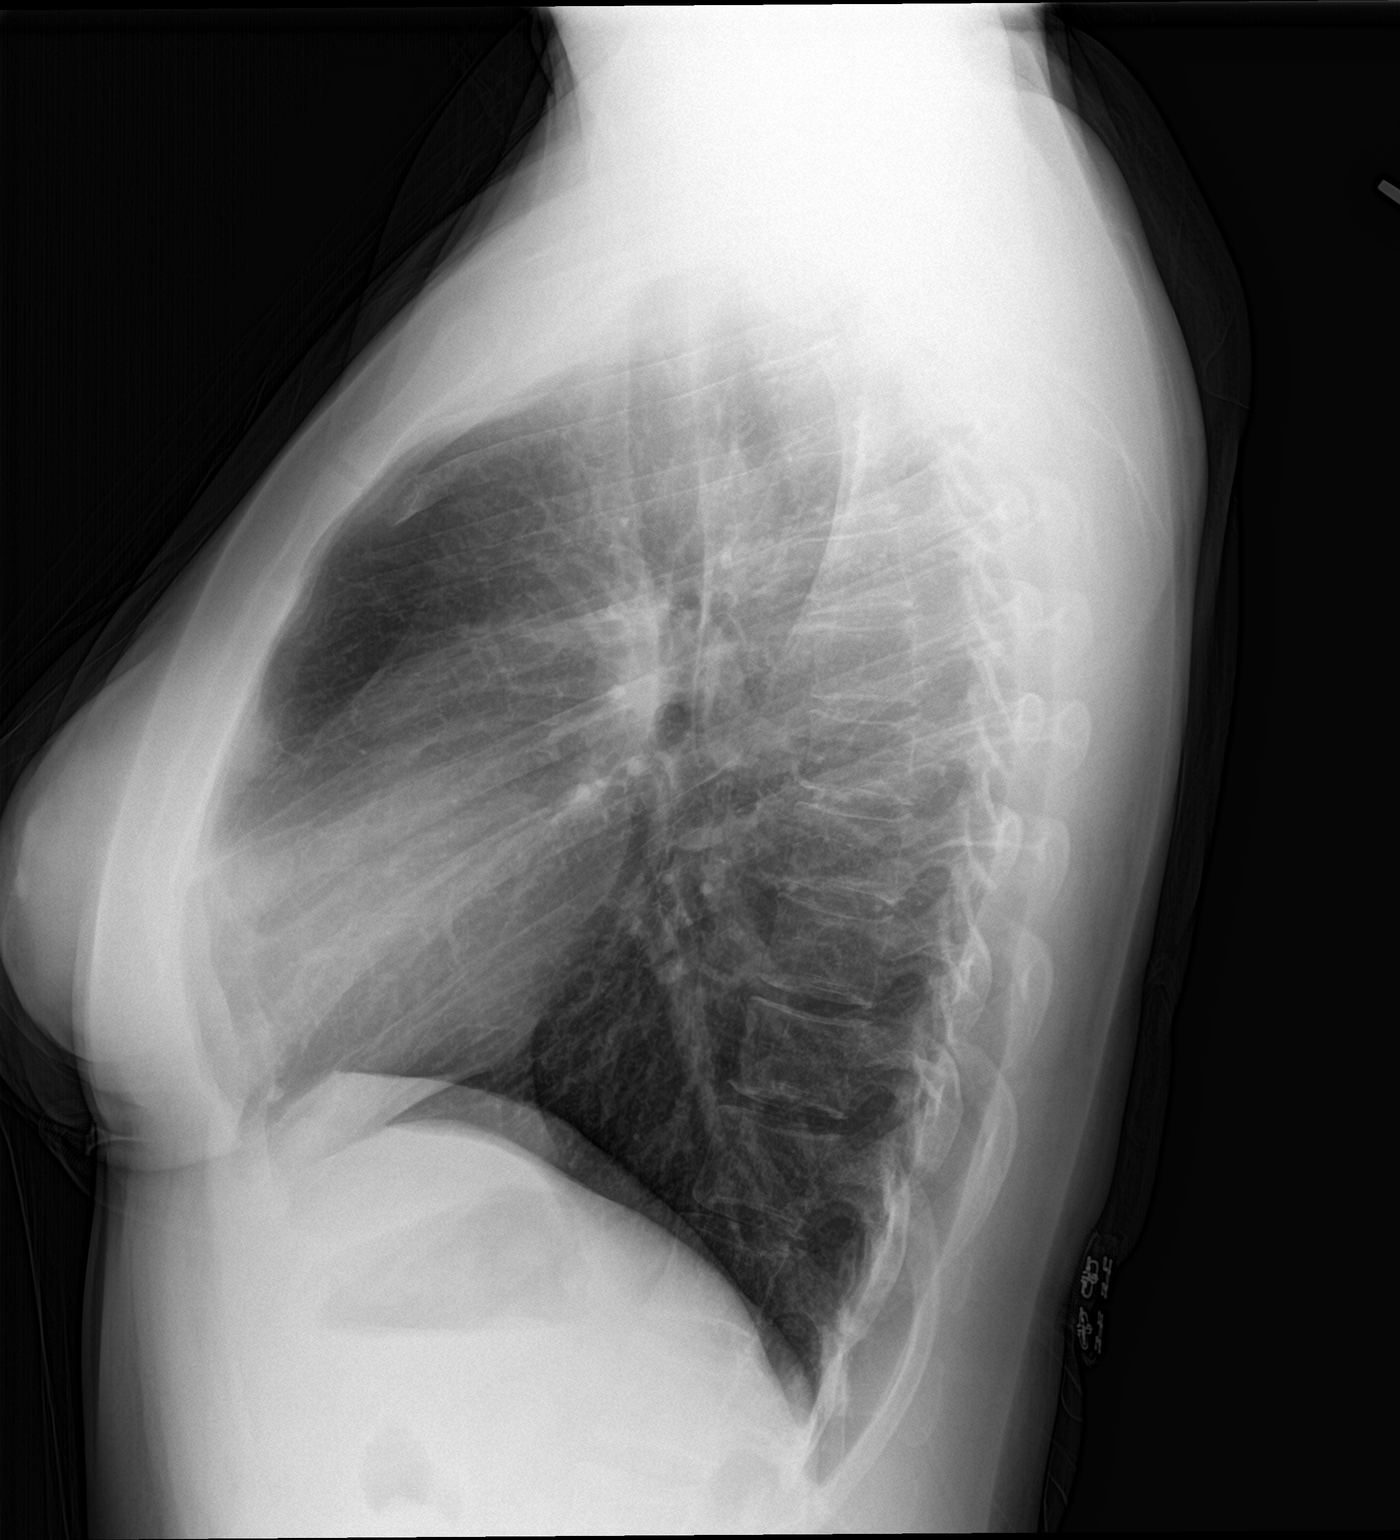

[2 of 2 positions shown; findings below may reference images not displayed]

FINDINGS: Heart size is normal. Mediastinal shadows are normal. The lungs are
clear. No bronchial thickening. No infiltrate, mass, effusion or
collapse. Pulmonary vascularity is normal. Bilateral breast
implants. No bony abnormality.
IMPRESSION: Normal chest

## 2019-09-01 ENCOUNTER — Telehealth: Payer: Self-pay | Admitting: Physician Assistant

## 2019-09-01 NOTE — Telephone Encounter (Signed)
Patient calling in wanting to be seen for a pap and to have thyroid levels checked. Would like a morning appointment. Not showing any open appointments. Former Programmer, applications patient. Last office visit 04/2017. Please advise.

## 2019-09-01 NOTE — Telephone Encounter (Signed)
Pls offer pt next available appt. If pt needs a sooner appt then given, then they are more than welcome to schedule an appt with another provider. Thanks.

## 2019-09-02 NOTE — Telephone Encounter (Signed)
Left patient a voicemail with information below. Let patient know to call us back to schedule an appointment in the office. °

## 2019-09-03 ENCOUNTER — Other Ambulatory Visit: Payer: Self-pay

## 2019-09-03 ENCOUNTER — Encounter: Payer: Self-pay | Admitting: Physician Assistant

## 2019-09-03 ENCOUNTER — Ambulatory Visit (INDEPENDENT_AMBULATORY_CARE_PROVIDER_SITE_OTHER): Payer: 59 | Admitting: Physician Assistant

## 2019-09-03 VITALS — BP 128/75 | HR 72 | Ht 66.0 in | Wt 153.0 lb

## 2019-09-03 DIAGNOSIS — N898 Other specified noninflammatory disorders of vagina: Secondary | ICD-10-CM | POA: Diagnosis not present

## 2019-09-03 DIAGNOSIS — J3489 Other specified disorders of nose and nasal sinuses: Secondary | ICD-10-CM | POA: Diagnosis not present

## 2019-09-03 DIAGNOSIS — E039 Hypothyroidism, unspecified: Secondary | ICD-10-CM | POA: Diagnosis not present

## 2019-09-03 DIAGNOSIS — E038 Other specified hypothyroidism: Secondary | ICD-10-CM

## 2019-09-03 MED ORDER — FLUCONAZOLE 150 MG PO TABS: 150.0000 mg | ORAL_TABLET | Freq: Once | ORAL | 0 refills | Status: AC

## 2019-09-03 MED ORDER — FLUTICASONE PROPIONATE 50 MCG/ACT NA SUSP: 2.0000 | Freq: Every day | NASAL | 6 refills | Status: AC

## 2019-09-03 MED ORDER — METRONIDAZOLE 500 MG PO TABS: 500.0000 mg | ORAL_TABLET | Freq: Two times a day (BID) | ORAL | 0 refills | Status: AC

## 2019-09-03 NOTE — Patient Instructions (Signed)
Metronidazole for BV.  Recheck TSH.  Flonase for sinus issues.

## 2019-09-03 NOTE — Progress Notes (Signed)
You have yeast and BV. Medication already sent for BV. I will also send diflucan for yeast.

## 2019-09-03 NOTE — Progress Notes (Signed)
d  Subjective:    Patient ID: Sabrina Park, female    DOB: 06/15/1991, 28 y.o.   MRN: [id number]  HPI  Pt is a 28 yo female who presents to the clinic with multiple concerns. She is most concerned with vaginal discharge noticed for the last week. She has a bad odor but also itchy. She is sexually active and uses condoms. No pain with intercourse. Denies any urinary symptoms. Not tried anything to make better.   Pt has had thyroid issues in the past. She has previously been on medication for thyroid. She would like to avoid medication. She feels like her thyroid could be off because she is more tired than usual, having trouble with weight gain and just does not feel good.    She also mentions some intermittent sinus drainage and pressure. Not taking anything daily. No fever, chills, headaches, cough, SOB, ST, or ear pain.   .. Active Ambulatory Problems    Diagnosis Date Noted  . Dysmenorrhea 02/05/2017  . Wheezing 02/05/2017  . Mild single current episode of major depressive disorder (HCC) 02/05/2017  . Anxiety 02/05/2017  . Subclinical hypothyroidism 05/08/2017  . Diarrhea 05/08/2017  . Need for HPV vaccination 05/15/2017  . Vaginal discharge 05/15/2017  . Chlamydia vaginitis/cervicitis 05/21/2017  . Hashimoto's disease 09/06/2019  . Sinus drainage 09/07/2019   Resolved Ambulatory Problems    Diagnosis Date Noted  . Pregnancy, incidental 03/11/2012   Past Medical History:  Diagnosis Date  . Anemia     Review of Systems See HPI.     Objective:   Physical Exam Vitals signs reviewed.  Constitutional:      Appearance: Normal appearance.  HENT:     Head: Normocephalic.     Right Ear: Tympanic membrane normal.     Left Ear: Tympanic membrane normal.     Nose: No congestion.     Mouth/Throat:     Mouth: Mucous membranes are moist.  Eyes:     Conjunctiva/sclera: Conjunctivae normal.  Neck:     Musculoskeletal: Normal range of motion. No muscular tenderness.   Cardiovascular:     Rate and Rhythm: Normal rate and regular rhythm.  Pulmonary:     Effort: Pulmonary effort is normal.     Breath sounds: Normal breath sounds.  Abdominal:     General: Bowel sounds are normal.     Palpations: Abdomen is soft.     Tenderness: There is no abdominal tenderness.  Genitourinary:    General: Normal vulva.     Vagina: Vaginal discharge present.     Rectum: Normal.     Comments: White thick discharge. nabothian cyst at [facility] cervix.  Lymphadenopathy:     Cervical: No cervical adenopathy.  Neurological:     General: No focal deficit present.     Mental Status: She is alert and oriented to person, place, and time.  Psychiatric:        Mood and Affect: Mood normal.           Assessment & Plan:  Marland KitchenMarland KitchenCaro was seen today for thyroid problem.  Diagnoses and all orders for this visit:  Subclinical hypothyroidism -     TSH -     Thyroid peroxidase antibody -     COMPLETE METABOLIC PANEL WITH GFR  Vaginal discharge -     metroNIDAZOLE (FLAGYL) 500 MG tablet; Take 1 tablet (500 mg total) by mouth 2 (two) times daily. -     WET PREP FOR TRICH, YEAST, CLUE -  SureSwab, Vaginosis/Vaginitis Plus -     fluconazole (DIFLUCAN) 150 MG tablet; Take 1 tablet (150 mg total) by mouth once for 1 dose. Repeat in 24-48 hours if symptoms persist.  Sinus drainage -     fluticasone (FLONASE) 50 MCG/ACT nasal spray; Place 2 sprays into both nostrils daily.   Will recheck thyroid function to determine if we should restart medication.   Wet prep showed yeast and BV. Treated for both. Sure swab sent off for complete STD testing.  Pap done 2018 normal. Due 2021.   Discussed sinus symptoms. Consider flonase daily or as needed. Follow up as needed.

## 2019-09-06 ENCOUNTER — Encounter: Payer: Self-pay | Admitting: Physician Assistant

## 2019-09-06 DIAGNOSIS — E063 Autoimmune thyroiditis: Secondary | ICD-10-CM | POA: Insufficient documentation

## 2019-09-06 LAB — COMPLETE METABOLIC PANEL WITH GFR
AG Ratio: 1.6 (calc) (ref 1.0–2.5)
ALT: 11 U/L (ref 6–29)
AST: 15 U/L (ref 10–30)
Albumin: 4.4 g/dL (ref 3.6–5.1)
Alkaline phosphatase (APISO): 43 U/L (ref 31–125)
BUN: 16 mg/dL (ref 7–25)
CO2: 25 mmol/L (ref 20–32)
Calcium: 9.8 mg/dL (ref 8.6–10.2)
Chloride: 106 mmol/L (ref 98–110)
Creat: 0.92 mg/dL (ref 0.50–1.10)
GFR, Est African American: 98 mL/min/{1.73_m2} (ref >=60)
GFR, Est Non African American: 85 mL/min/{1.73_m2} (ref >=60)
Globulin: 2.7 g/dL (calc) (ref 1.9–3.7)
Glucose, Bld: 87 mg/dL (ref 65–99)
Potassium: 4.1 mmol/L (ref 3.5–5.3)
Sodium: 138 mmol/L (ref 135–146)
Total Bilirubin: 0.6 mg/dL (ref 0.2–1.2)
Total Protein: 7.1 g/dL (ref 6.1–8.1)

## 2019-09-06 LAB — THYROID PEROXIDASE ANTIBODY: Thyroperoxidase Ab SerPl-aCnc: 104 IU/mL — ABNORMAL HIGH (ref <9)

## 2019-09-06 LAB — TSH: TSH: 3.7 mIU/L

## 2019-09-06 NOTE — Progress Notes (Signed)
Call pt: thyroid is in normal range but upper limits of normal for TSH> meaning you are leaning more towards hypo thyroidism. I do think that thyroid medication could make you feel a little better if you wanted but lab do not show necessity.  Kidney, liver, glucose look good.

## 2019-09-06 NOTE — Progress Notes (Signed)
You do have antibodies against your thyroid. Your thyroid disease is called hashimoto's. Eventually your free levels will likely go into an abnormal range. You can start selenium 200mg , Zinc 30mg , and b12 1031mcg to help your thyroid and we can keep regular checks on levels.

## 2019-09-07 ENCOUNTER — Encounter: Payer: Self-pay | Admitting: Physician Assistant

## 2019-09-07 DIAGNOSIS — J3489 Other specified disorders of nose and nasal sinuses: Secondary | ICD-10-CM | POA: Insufficient documentation

## 2019-09-09 LAB — WET PREP FOR TRICH, YEAST, CLUE
MICRO NUMBER:: 1073056
Specimen Quality: ADEQUATE

## 2019-09-09 LAB — SURESWAB, VAGINOSIS/VAGINITIS PLUS
Atopobium vaginae: 6.8 Log (cells/mL)
C. albicans, DNA: NOT DETECTED
C. glabrata, DNA: NOT DETECTED
C. parapsilosis, DNA: NOT DETECTED
C. trachomatis RNA, TMA: NOT DETECTED
C. tropicalis, DNA: NOT DETECTED
Gardnerella vaginalis: 7.8 Log (cells/mL)
LACTOBACILLUS SPECIES: NOT DETECTED Log (cells/mL)
MEGASPHAERA SPECIES: 7.1 Log (cells/mL)
N. gonorrhoeae RNA, TMA: NOT DETECTED
Trichomonas vaginalis RNA: NOT DETECTED
